# Patient Record
Sex: Male | Born: 1935 | Race: Black or African American | Hispanic: No | Marital: Married | Smoking: Never smoker
Health system: Southern US, Community
[De-identification: ages and names within clinical notes are randomized; demographics above are authoritative.]

## PROBLEM LIST (undated history)

## (undated) DIAGNOSIS — C801 Malignant (primary) neoplasm, unspecified: Secondary | ICD-10-CM

## (undated) DIAGNOSIS — I10 Essential (primary) hypertension: Secondary | ICD-10-CM

## (undated) HISTORY — PX: EYE SURGERY: SHX253

---

## 2007-03-13 ENCOUNTER — Other Ambulatory Visit: Payer: Self-pay

## 2007-03-13 ENCOUNTER — Emergency Department: Payer: Self-pay | Admitting: Emergency Medicine

## 2007-04-13 ENCOUNTER — Other Ambulatory Visit: Payer: Self-pay

## 2007-04-13 ENCOUNTER — Emergency Department: Payer: Self-pay | Admitting: Emergency Medicine

## 2010-02-14 ENCOUNTER — Ambulatory Visit: Payer: Self-pay | Admitting: Family Medicine

## 2010-02-14 ENCOUNTER — Observation Stay: Payer: Self-pay | Admitting: Internal Medicine

## 2013-04-29 ENCOUNTER — Emergency Department: Payer: Self-pay | Admitting: Emergency Medicine

## 2017-01-01 ENCOUNTER — Emergency Department
Admission: EM | Admit: 2017-01-01 | Discharge: 2017-01-01 | Disposition: A | Discharge status: Home / Self Care | Payer: Medicare HMO | Attending: Emergency Medicine | Admitting: Emergency Medicine

## 2017-01-01 DIAGNOSIS — W5501XA Bitten by cat, initial encounter: Secondary | ICD-10-CM | POA: Insufficient documentation

## 2017-01-01 DIAGNOSIS — Y999 Unspecified external cause status: Secondary | ICD-10-CM | POA: Diagnosis not present

## 2017-01-01 DIAGNOSIS — Y929 Unspecified place or not applicable: Secondary | ICD-10-CM | POA: Diagnosis not present

## 2017-01-01 DIAGNOSIS — Y939 Activity, unspecified: Secondary | ICD-10-CM | POA: Insufficient documentation

## 2017-01-01 DIAGNOSIS — S51851A Open bite of right forearm, initial encounter: Secondary | ICD-10-CM | POA: Diagnosis not present

## 2017-01-01 MED ORDER — AMOXICILLIN-POT CLAVULANATE 875-125 MG PO TABS: 1.0000 | ORAL_TABLET | Freq: Two times a day (BID) | ORAL | 0 refills | Status: AC

## 2017-01-01 MED ORDER — AMOXICILLIN-POT CLAVULANATE 875-125 MG PO TABS
1.0000 | ORAL_TABLET | Freq: Once | ORAL | Status: AC
  Administered 2017-01-01: 1 via ORAL
  Filled 2017-01-01: qty 1

## 2017-01-01 NOTE — ED Notes (Signed)
Attempted to called animal control. Did not get an answer. Left message for someone to call us back.   Number provided for pt as well.

## 2017-01-01 NOTE — Discharge Instructions (Signed)
You are being treated for skin infection after cat bite with antibiotic called Augmentin.  Animal control has been notified to be in touch about 10 monitoring.  You need to have the infection rechecked in 2 days to ensure that its heading their direction.  Return to the emergency department immediately for any worsening redness or pain or swelling, any extension outside the line that was marked on your skin, or certainly any system wide symptoms such as fever, headache, vomiting, or any other symptoms concerning to you.

## 2017-01-01 NOTE — ED Provider Notes (Signed)
[facility] Emergency Department Provider Note ____________________________________________   I have reviewed the triage vital signs and the triage nursing note.  HISTORY  Chief Complaint Animal Bite   Historian Patient  HPI Joseph Estrada is a 83 y.o. male sustained Bite from his cat while playing 2 days ago to his right top of the forearm. There is an area of redness and swelling without drainage. There is a small scab. Mild tenderness. States his cat is up-to-date on rabies vaccination.  No systemic issues including headache, vomiting, or fevers. Nothing makes it worse or better.    No past medical history on file.  There are no active problems to display for this patient.   No past surgical history on file.  Prior to Admission medications   Medication Sig Start Date End Date Taking? Authorizing Provider  amoxicillin-clavulanate (AUGMENTIN) 875-125 MG tablet Take 1 tablet by mouth 2 (two) times daily. 01/01/17 01/11/17  Lisa Roca, MD    No Known Allergies  No family history on file.  Social History Social History  Substance Use Topics  . Smoking status: Not on file  . Smokeless tobacco: Not on file  . Alcohol use Not on file    Review of Systems  Constitutional: Negative for fever. Eyes: Negative for visual changes. ENT: Negative for sore throat. Cardiovascular: Negative for chest pain. Respiratory: Negative for shortness of breath. Gastrointestinal: Negative for abdominal pain, vomiting and diarrhea. Genitourinary:  Musculoskeletal: Negative for back pain. Skin: Negative for rash. Neurological: Negative for headache. 10 point Review of Systems otherwise negative ____________________________________________   PHYSICAL EXAM:  VITAL SIGNS: ED Triage Vitals  Enc Vitals Group     BP 01/01/17 0548 (!) 172/73     Pulse Rate 01/01/17 0548 86     Resp 01/01/17 0548 18     Temp 01/01/17 0548 98 F (36.7 C)     Temp Source  01/01/17 0548 Oral     SpO2 01/01/17 0548 98 %     Weight 01/01/17 0546 160 lb (72.6 kg)     Height 01/01/17 0546 5\' 9"  (1.753 m)     Head Circumference --      Peak Flow --      Pain Score 01/01/17 0545 2     Pain Loc --      Pain Edu? --      Excl. in [place]? --      Constitutional: Alert and oriented. Well appearing and in no distress. HEENT   Head: Normocephalic and atraumatic.      Eyes: Conjunctivae are normal. PERRL. Normal extraocular movements.      Ears:         Nose: No congestion/rhinnorhea.   Mouth/Throat: Mucous membranes are moist.   Neck: No stridor. Cardiovascular/Chest: Normal rate, regular rhythm.  No murmurs, rubs, or gallops. Respiratory: Normal respiratory effort without tachypnea nor retractions. Breath sounds are clear and equal bilaterally. No wheezes/rales/rhonchi. Gastrointestinal:  Genitourinary/rectal:Deferred Musculoskeletal: Right dorsal forearm has an area of redness and swelling with a scab, approximately palm sized patch which was marked by skin marker. No fluctuance. No blistering. Neurologic:  Normal speech and language. No gross or focal neurologic deficits are appreciated. Skin:  Skin is warm, dry and intact. Erythema and warmth to the right dorsal forearm.Marland Kitchen Psychiatric: Mood and affect are normal. Speech and behavior are normal. Patient exhibits appropriate insight and judgment.   ____________________________________________  LABS (pertinent positives/negatives)  Labs Reviewed - No data to display  ____________________________________________  EKG I, Lisa Roca, MD, the attending physician have personally viewed and interpreted all ECGs.  None ____________________________________________  RADIOLOGY All Xrays were viewed by me. Imaging interpreted by Radiologist.  None __________________________________________  PROCEDURES  Procedure(s) performed: None  Critical Care performed:  None  ____________________________________________   ED COURSE / ASSESSMENT AND PLAN  Pertinent labs & imaging results that were available during my care of the patient were reviewed by me and considered in my medical decision making (see chart for details).   Mr. Ward Givens presents 2 days after his own vaccinated cat bit him while playing, and states he typically doesn't get infection, but this time it looks infected.  It does look infected, patient will be treated with Augmentin 875mg  bid x 10 day course. His first tablet was given here, he sent with a prescription.  Appears reasonable for outpatient management.  I asked the nurse to contact/report the animal bite.  Patient understands report to animal control for likely in home 10 day monitoring.    CONSULTATIONS:  None Patient / Family / Caregiver informed of clinical course, medical decision-making process, and agree with plan.   I discussed return precautions, follow-up instructions, and discharge instructions with patient and/or family.  Discharge instructions:  You are being treated for skin infection after cat bite with antibiotic called Augmentin.  Animal control has been notified to be in touch about 10 monitoring.  You need to have the infection rechecked in 2 days to ensure that its heading their direction.  Return to the emergency department immediately for any worsening redness or pain or swelling, any extension outside the line that was marked on your skin, or certainly any system wide symptoms such as fever, headache, vomiting, or any other symptoms concerning to you.  ___________________________________________   FINAL CLINICAL IMPRESSION(S) / ED DIAGNOSES   Final diagnoses:  Cat bite, initial encounter              Note: This dictation was prepared with Dragon dictation. Any transcriptional errors that result from this process are unintentional    Lisa Roca, MD 01/01/17 [phone]

## 2017-01-01 NOTE — ED Triage Notes (Signed)
Reports his cat bite him 2 days ago.  Patient with area noted to right forearm that is red and swollen.

## 2017-07-01 ENCOUNTER — Emergency Department: Payer: Medicare HMO

## 2017-07-01 ENCOUNTER — Encounter: Payer: Self-pay | Admitting: Emergency Medicine

## 2017-07-01 ENCOUNTER — Emergency Department
Admission: EM | Admit: 2017-07-01 | Discharge: 2017-07-01 | Disposition: A | Discharge status: Home / Self Care | Payer: Medicare HMO | Attending: Emergency Medicine | Admitting: Emergency Medicine

## 2017-07-01 DIAGNOSIS — T189XXA Foreign body of alimentary tract, part unspecified, initial encounter: Secondary | ICD-10-CM | POA: Insufficient documentation

## 2017-07-01 DIAGNOSIS — R1310 Dysphagia, unspecified: Secondary | ICD-10-CM | POA: Diagnosis present

## 2017-07-01 DIAGNOSIS — X58XXXA Exposure to other specified factors, initial encounter: Secondary | ICD-10-CM | POA: Insufficient documentation

## 2017-07-01 DIAGNOSIS — I10 Essential (primary) hypertension: Secondary | ICD-10-CM | POA: Diagnosis not present

## 2017-07-01 DIAGNOSIS — Y929 Unspecified place or not applicable: Secondary | ICD-10-CM | POA: Insufficient documentation

## 2017-07-01 DIAGNOSIS — Y9389 Activity, other specified: Secondary | ICD-10-CM | POA: Insufficient documentation

## 2017-07-01 DIAGNOSIS — Z8546 Personal history of malignant neoplasm of prostate: Secondary | ICD-10-CM | POA: Insufficient documentation

## 2017-07-01 DIAGNOSIS — Y998 Other external cause status: Secondary | ICD-10-CM | POA: Diagnosis not present

## 2017-07-01 HISTORY — DX: Malignant (primary) neoplasm, unspecified: C80.1

## 2017-07-01 HISTORY — DX: Essential (primary) hypertension: I10

## 2017-07-01 NOTE — ED Notes (Signed)
Patient states he was able to drink water without difficulty.

## 2017-07-01 NOTE — Discharge Instructions (Signed)
Return to the ER for new, worsening, or recurrent pain in the throat, feeling like your throat is closing, difficulty breathing, cough, weakness, or any other new or worsening symptoms that concern you.

## 2017-07-01 NOTE — ED Triage Notes (Signed)
Pt arrived via EMS from home. Pt reports he was taking his medications about an hour ago when one of the pills got stuck in his throat, pt states the same pill frequently gets stuck on the roof of his mouth, but never this bad. Pt states he choked and then threw up some water. Pt also concerned when he blew his nose, blood came out.  Pt c/o being hoarse now and having a tickle in his throat.

## 2017-07-01 NOTE — ED Notes (Signed)
Daughter at bedside, determine the patient choked on Flomax capsule.

## 2017-07-01 NOTE — ED Provider Notes (Signed)
[name] Emergency Department Provider Note ____________________________________________   First MD Initiated Contact with Patient 07/01/17 1557     (approximate)  I have reviewed the triage vital signs and the nursing notes.   HISTORY  Chief Complaint Dysphagia    HPI Joseph Estrada is a 83 y.o. male with past medical history of prostate cancer hypertension who presents with throat discomfort, acute onset after he got a pill stuck in the throat, associated with coughing and briefly with shortness of breath, now resolved.  Patient states that after he took the pill it felt stuck, he began to cough, and then had a "panic attack."  Patient states that he still feels a "tickle" in the back of his throat but other symptoms have resolved.  He reports small amount of blood when he blew his nose.  No chest pain.   Past Medical History:  Diagnosis Date  . Cancer [facility])    prostate  . Hypertension     There are no active problems to display for this patient.   History reviewed. No pertinent surgical history.  Prior to Admission medications   Not on File    Allergies Patient has no known allergies.  History reviewed. No pertinent family history.  Social History Social History  Substance Use Topics  . Smoking status: Never Smoker  . Smokeless tobacco: Never Used  . Alcohol use No    Review of Systems  Constitutional: No fever/chills Eyes: No redness. ENT: Positive for sore throat. Cardiovascular: Denies chest pain. Respiratory: Denies shortness of breath. Gastrointestinal: No nausea, no vomiting.   Genitourinary: Negative for flank pain.  Musculoskeletal: Negative for back pain. Skin: Negative for rash. Neurological: Negative for headache.   ____________________________________________   PHYSICAL EXAM:  VITAL SIGNS: ED Triage Vitals  Enc Vitals Group     BP 07/01/17 1533 139/64     Pulse Rate 07/01/17 1533 71     Resp 07/01/17  1533 18     Temp 07/01/17 1533 98.1 F (36.7 C)     Temp Source 07/01/17 1533 Oral     SpO2 07/01/17 1523 98 %     Weight 07/01/17 1524 150 lb (68 kg)     Height 07/01/17 1524 5\' 9"  (1.753 m)     Head Circumference --      Peak Flow --      Pain Score 07/01/17 1524 0     Pain Loc --      Pain Edu? --      Excl. in [place]? --     Constitutional: Alert and oriented. Well appearing and in no acute distress. Eyes: Conjunctivae are normal.  Head: Atraumatic. Nose: No congestion/rhinnorhea. Mouth/Throat: Mucous membranes are moist.  Oropharynx clear.  No stridor.  Neck: Normal range of motion.  Cardiovascular: Normal rate, regular rhythm. Grossly normal heart sounds.  Good peripheral circulation. Respiratory: Normal respiratory effort.  No retractions. Trace wheeze to L lung.  Gastrointestinal: No distention.  Musculoskeletal:  Extremities warm and well perfused.  Neurologic:  Normal speech and language. No gross focal neurologic deficits are appreciated.  Skin:  Skin is warm and dry. No rash noted. Psychiatric: Mood and affect are normal. Speech and behavior are normal.  ____________________________________________   LABS (all labs ordered are listed, but only abnormal results are displayed)  Labs Reviewed - No data to display ____________________________________________  EKG   ____________________________________________  RADIOLOGY  CXR: No acute findings.  ____________________________________________   PROCEDURES  Procedure(s) performed: No  Critical Care performed: No ____________________________________________   INITIAL IMPRESSION / ASSESSMENT AND PLAN / ED COURSE  Pertinent labs & imaging results that were available during my care of the patient were reviewed by me and considered in my medical decision making (see chart for details).  83 year old male presents with resolved cough and difficulty breathing after he felt a capsule type pill get stuck in the  back of his throat.  Patient reports a tickle to the back of the throat but is otherwise asymptomatic.  On exam, patient is well-appearing, vital signs are normal, and is trace wheeze to the left lung but otherwise no significant findings.  Patient is tolerating PO fluids without difficulty.  Overall suspect the patient had coughing from having the pill stuck briefly in the throat, and there is a small chance that he aspirated, although given his lack of shortness of breath and reassuring exam currently I have low suspicion for this.  Will obtain CXR; if negative d/c home.     ----------------------------------------- 5:48 PM on 07/01/2017 -----------------------------------------  Chest x-ray is negative and patient remains comfortable.  Return precautions given.  Will discharge home.  ____________________________________________   FINAL CLINICAL IMPRESSION(S) / ED DIAGNOSES  Final diagnoses:  Swallowed foreign body, initial encounter      NEW MEDICATIONS STARTED DURING THIS VISIT:  New Prescriptions   No medications on file     Note:  This document was prepared using Dragon voice recognition software and may include unintentional dictation errors.     Arta Silence, MD 07/01/17 [phone]

## 2017-12-24 ENCOUNTER — Other Ambulatory Visit: Payer: Self-pay

## 2017-12-24 ENCOUNTER — Ambulatory Visit
Admission: EM | Admit: 2017-12-24 | Discharge: 2017-12-24 | Disposition: A | Discharge status: Home / Self Care | Payer: Medicare HMO | Attending: Emergency Medicine | Admitting: Emergency Medicine

## 2017-12-24 DIAGNOSIS — H1131 Conjunctival hemorrhage, right eye: Secondary | ICD-10-CM | POA: Diagnosis not present

## 2017-12-24 NOTE — ED Triage Notes (Signed)
Pt states sclera redness yesterday but much worse on waking today. No itching, burning, pain, or drainage.  VISUAL ACUITY: Corrected right eye: 20/30 Corrected left eye: 20/30 Corrected both eyes: 20/25

## 2017-12-24 NOTE — ED Provider Notes (Signed)
[facility]    CSN: [id number] Arrival date & time: 12/24/17  1446     History   Chief Complaint Chief Complaint  Patient presents with  . Eye Problem    HPI Joseph Estrada is a 84 y.o. male.   HPI  Male presents redness of his right eye which his daughter noticed today.  It seemed to be worsening today than yesterday according to the patient he has had no itching no burning no pain no drainage no visual changes.  Had no matting.  Denies photophobia does not wear contact lenses.  Rubbing his eye vigorously yesterday.  Visual acuity is normal corrected.        Past Medical History:  Diagnosis Date  . Cancer [facility])    prostate  . Hypertension     There are no active problems to display for this patient.   Past Surgical History:  Procedure Laterality Date  . EYE SURGERY         Home Medications    Prior to Admission medications   Medication Sig Start Date End Date Taking? Authorizing Provider  aspirin EC 81 MG tablet Take by mouth. 01/15/16 09/23/29 Yes [provider]  [place] 5000-500 UNIT/5ML OIL Take by mouth. 07/20/13  Yes [provider]  losartan-hydrochlorothiazide (HYZAAR) 100-25 MG tablet Take by mouth. 11/15/17 11/15/18 Yes [provider]  tamsulosin (FLOMAX) 0.4 MG CAPS capsule Take 0.4 mg by mouth.   Yes [provider]    Family History History reviewed. No pertinent family history.  Social History Social History   Tobacco Use  . Smoking status: Never Smoker  . Smokeless tobacco: Never Used  Substance Use Topics  . Alcohol use: No  . Drug use: No     Allergies   Patient has no known allergies.   Review of Systems Review of Systems  Constitutional: Negative for activity change, appetite change, chills, diaphoresis, fatigue and fever.  Eyes: Positive for redness. Negative for photophobia.  All other systems reviewed and are negative.    Physical Exam Triage Vital Signs ED  Triage Vitals  Enc Vitals Group     BP 12/24/17 1504 131/73     Pulse Rate 12/24/17 1504 78     Resp 12/24/17 1504 18     Temp 12/24/17 1504 97.9 F (36.6 C)     Temp Source 12/24/17 1504 Oral     SpO2 12/24/17 1504 100 %     Weight 12/24/17 1459 145 lb (65.8 kg)     Height 12/24/17 1459 5\' 9"  (1.753 m)     Head Circumference --      Peak Flow --      Pain Score 12/24/17 1459 0     Pain Loc --      Pain Edu? --      Excl. in [place]? --    No data found.  Updated Vital Signs BP 131/73 (BP Location: Right Arm)   Pulse 78   Temp 97.9 F (36.6 C) (Oral)   Resp 18   Ht 5\' 9"  (1.753 m)   Wt 145 lb (65.8 kg)   SpO2 100%   BMI 21.41 kg/m   Visual Acuity Right Eye Distance:   Left Eye Distance:   Bilateral Distance:    Right Eye Near:   Left Eye Near:    Bilateral Near:     Physical Exam  Constitutional: He is oriented to person, place, and time. He appears well-developed and well-nourished.  No distress.  HENT:  Head: Normocephalic.  Eyes: Pupils are equal, round, and reactive to light. EOM are normal. Right eye exhibits no discharge. Left eye exhibits no discharge.  Examination of the right eye shows PERRLA with EOMs intact.  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  There is a subconjunctival hemorrhage over the medial aspect of the conjunctivae bending to just midpoint of the iris mostly inferior.  Neck: Normal range of motion.  Pulmonary/Chest: Effort normal and breath sounds normal.  Musculoskeletal: Normal range of motion.  Lymphadenopathy:    He has no cervical adenopathy.  Neurological: He is alert and oriented to person, place, and time.  Skin: Skin is warm and dry. He is not diaphoretic.  Psychiatric: He has a normal mood and affect. His behavior is normal. Judgment and thought content normal.  Nursing note and vitals reviewed.    UC Treatments / Results  Labs (all labs ordered are listed, but only abnormal results are displayed) Labs Reviewed - No data to  display  EKG None Radiology No results found.  Procedures Procedures (including critical care time)  Medications Ordered in UC Medications - No data to display   Initial Impression / Assessment and Plan / UC Course  I have reviewed the triage vital signs and the nursing notes.  Pertinent labs & imaging results that were available during my care of the patient were reviewed by me and considered in my medical decision making (see chart for details).     Plan: 1. Test/x-ray results and diagnosis reviewed with patient 2. rx as per orders; risks, benefits, potential side effects reviewed with patient 3. Recommend supportive treatment with reassurance.  Is not better in a week or so he should follow-up with an ophthalmologist. Warned Against rubbing his eye any more.  Compresses may be comforting. 4. F/u prn if symptoms worsen or don't improve   Final Clinical Impressions(s) / UC Diagnoses   Final diagnoses:  Subconjunctival bleed, right    ED Discharge Orders    None       Controlled Substance Prescriptions [place] Controlled Substance Registry consulted? Not Applicable   Lorin Picket, PA-C 12/24/17 1528

## 2018-12-22 ENCOUNTER — Other Ambulatory Visit: Payer: Self-pay

## 2018-12-22 ENCOUNTER — Emergency Department: Payer: Medicare HMO

## 2018-12-22 ENCOUNTER — Emergency Department
Admission: EM | Admit: 2018-12-22 | Discharge: 2018-12-22 | Disposition: A | Discharge status: Home / Self Care | Payer: Medicare HMO | Attending: Emergency Medicine | Admitting: Emergency Medicine

## 2018-12-22 DIAGNOSIS — Z79899 Other long term (current) drug therapy: Secondary | ICD-10-CM | POA: Insufficient documentation

## 2018-12-22 DIAGNOSIS — C61 Malignant neoplasm of prostate: Secondary | ICD-10-CM | POA: Diagnosis not present

## 2018-12-22 DIAGNOSIS — R270 Ataxia, unspecified: Secondary | ICD-10-CM

## 2018-12-22 DIAGNOSIS — I10 Essential (primary) hypertension: Secondary | ICD-10-CM | POA: Diagnosis not present

## 2018-12-22 DIAGNOSIS — E860 Dehydration: Secondary | ICD-10-CM

## 2018-12-22 DIAGNOSIS — Z7982 Long term (current) use of aspirin: Secondary | ICD-10-CM | POA: Insufficient documentation

## 2018-12-22 DIAGNOSIS — R42 Dizziness and giddiness: Secondary | ICD-10-CM

## 2018-12-22 DIAGNOSIS — R7989 Other specified abnormal findings of blood chemistry: Secondary | ICD-10-CM

## 2018-12-22 LAB — URINALYSIS, COMPLETE (UACMP) WITH MICROSCOPIC
Bacteria, UA: NONE SEEN
Bilirubin Urine: NEGATIVE
Glucose, UA: NEGATIVE mg/dL
Hgb urine dipstick: NEGATIVE
Ketones, ur: NEGATIVE mg/dL
Leukocytes,Ua: NEGATIVE
Nitrite: NEGATIVE
Protein, ur: NEGATIVE mg/dL
Specific Gravity, Urine: 1.012 (ref 1.005–1.030)
Squamous Epithelial / LPF: NONE SEEN (ref 0–5)
pH: 6 (ref 5.0–8.0)

## 2018-12-22 LAB — COMPREHENSIVE METABOLIC PANEL
ALT: 14 U/L (ref 0–44)
AST: 22 U/L (ref 15–41)
Albumin: 3.8 g/dL (ref 3.5–5.0)
Alkaline Phosphatase: 60 U/L (ref 38–126)
Anion gap: 8 (ref 5–15)
BUN: 24 mg/dL — ABNORMAL HIGH (ref 8–23)
CO2: 27 mmol/L (ref 22–32)
Calcium: 8.6 mg/dL — ABNORMAL LOW (ref 8.9–10.3)
Chloride: 103 mmol/L (ref 98–111)
Creatinine, Ser: 1.49 mg/dL — ABNORMAL HIGH (ref 0.61–1.24)
GFR calc Af Amer: 47 mL/min — ABNORMAL LOW (ref >60)
GFR calc non Af Amer: 41 mL/min — ABNORMAL LOW (ref >60)
Glucose, Bld: 99 mg/dL (ref 70–99)
Potassium: 3.5 mmol/L (ref 3.5–5.1)
Sodium: 138 mmol/L (ref 135–145)
Total Bilirubin: 0.7 mg/dL (ref 0.3–1.2)
Total Protein: 6.5 g/dL (ref 6.5–8.1)

## 2018-12-22 LAB — CBC WITH DIFFERENTIAL/PLATELET
Abs Immature Granulocytes: 0.01 10*3/uL (ref 0.00–0.07)
Basophils Absolute: 0 10*3/uL (ref 0.0–0.1)
Basophils Relative: 0 %
Eosinophils Absolute: 0.1 10*3/uL (ref 0.0–0.5)
Eosinophils Relative: 1 %
HCT: 37 % — ABNORMAL LOW (ref 39.0–52.0)
Hemoglobin: 12.4 g/dL — ABNORMAL LOW (ref 13.0–17.0)
Immature Granulocytes: 0 %
Lymphocytes Relative: 40 %
Lymphs Abs: 1.7 10*3/uL (ref 0.7–4.0)
MCH: 29.4 pg (ref 26.0–34.0)
MCHC: 33.5 g/dL (ref 30.0–36.0)
MCV: 87.7 fL (ref 80.0–100.0)
Monocytes Absolute: 0.4 10*3/uL (ref 0.1–1.0)
Monocytes Relative: 10 %
Neutro Abs: 2.1 10*3/uL (ref 1.7–7.7)
Neutrophils Relative %: 49 %
Platelets: 184 10*3/uL (ref 150–400)
RBC: 4.22 MIL/uL (ref 4.22–5.81)
RDW: 13.7 % (ref 11.5–15.5)
WBC: 4.3 10*3/uL (ref 4.0–10.5)
nRBC: 0 % (ref 0.0–0.2)

## 2018-12-22 LAB — TROPONIN I: Troponin I: <0.03 ng/mL (ref <0.03)

## 2018-12-22 LAB — LIPASE, BLOOD: Lipase: 25 U/L (ref 11–51)

## 2018-12-22 MED ORDER — SODIUM CHLORIDE 0.9 % IV BOLUS
500.0000 mL | Freq: Once | INTRAVENOUS | Status: AC
  Administered 2018-12-22: 500 mL via INTRAVENOUS

## 2018-12-22 MED ORDER — MECLIZINE HCL 12.5 MG PO TABS: 12.5000 mg | ORAL_TABLET | Freq: Three times a day (TID) | ORAL | 0 refills | Status: AC | PRN

## 2018-12-22 NOTE — ED Triage Notes (Signed)
Pt from home with dizziness that began at 0400 this am. perrl brisk 78mm. Pt alert and oriented x4, moving all extremities. Pt denies pain, nausea, shob, cough. md at bedside. Pt states he is having difficulty maintaining his balance.

## 2018-12-22 NOTE — ED Notes (Signed)
This RN spoke to pt daughter who stated she would come pick pt up.

## 2018-12-22 NOTE — ED Notes (Signed)
Pt stated that he felt dizzy when he went from a lying to sitting position.

## 2018-12-22 NOTE — ED Notes (Signed)
Pt given urinal to urinate into. Stated he still felt dizzy so instructed not to get out of bed. Verbalized has used urinal before and felt comfortable using it on own on stretcher.

## 2018-12-22 NOTE — ED Notes (Signed)
Pt given phone to call daughter. Stood beside bed to use urinal. No distress noted.

## 2018-12-22 NOTE — ED Provider Notes (Signed)
Patient reports his dizziness is better but not completely gone.  He is able to walk now.  RI is negative shows no focal new findings.  I will let him go with some meclizine.  Have him follow-up with his regular doctor this coming week and return if he has any worsening.  I discussed with him the fact that it could have been a stroke or something in the inner ear but the fact that the MRI is negative and he still having symptoms means is almost definitely something in his inner ear.  Meclizine may help with that.   Nena Polio, MD 12/22/18 1037

## 2018-12-22 NOTE — ED Provider Notes (Signed)
[facility] Emergency Department Provider Note  ____________________________________________   First MD Initiated Contact with Patient 12/22/18 [phone]     (approximate)  I have reviewed the triage vital signs and the nursing notes.   HISTORY  Chief Complaint Dizziness    HPI Joseph Estrada is a 85 y.o. male with medical history as listed below with medical history as listed below who presents by EMS for evaluation of acute onset and severe dizziness and difficulty with ambulation.  He reports he has been in his normal state of health recently, even working outside in the yard, and he felt normal when he went to sleep.  However he got up to go to the bathroom and he had a very difficult time maintaining his balance and reports that he was "bouncing off the walls".  He had a difficult time finding his way back to the bed.  He did not feel like he was going to pass out or become lightheaded, just felt very off balance with difficulty ambulating.  He denies headache, neck pain, nasal congestion, sore throat, chest pain, shortness of breath, cough, abdominal pain, nausea, vomiting, and visual changes.  He has had no pain when he urinates.  He states that he had a similar episode last summer and they gave him a medicine to help with the dizziness but he does not have to take it regularly.  He has been compliant with his blood pressure medicine.  He says he used to take an aspirin but he was told he did not have to take it anymore and he takes no other blood thinners.  He feels better than he did before but he still feels a little bit "off".  At no point did he have any weakness or numbness in any of his arms or his legs, he just felt like he could not walk.   He has been trying to self isolate except for with his family given the current COVID-19 pandemic but he has not been in contact with anyone known to have been diagnosed with the disease.        Past Medical History:   Diagnosis Date  . Cancer [facility])    prostate  . Hypertension     There are no active problems to display for this patient.   Past Surgical History:  Procedure Laterality Date  . EYE SURGERY      Prior to Admission medications   Medication Sig Start Date End Date Taking? Authorizing Provider  aspirin EC 81 MG tablet Take by mouth. 01/15/16 09/23/29  [provider]  [place] 5000-500 UNIT/5ML OIL Take by mouth. 07/20/13   [provider]  losartan-hydrochlorothiazide (HYZAAR) 100-25 MG tablet Take by mouth. 11/15/17 11/15/18  [provider]  tamsulosin (FLOMAX) 0.4 MG CAPS capsule Take 0.4 mg by mouth.    [provider]    Allergies Patient has no known allergies.  No family history on file.  Social History Social History   Tobacco Use  . Smoking status: Never Smoker  . Smokeless tobacco: Never Used  Substance Use Topics  . Alcohol use: No  . Drug use: No    Review of Systems Constitutional: No fever/chills Eyes: No visual changes. ENT: No sore throat. Cardiovascular: Denies chest pain. Respiratory: Denies shortness of breath. Gastrointestinal: No abdominal pain.  No nausea, no vomiting.  No diarrhea.  No constipation. Genitourinary: Negative for dysuria. Musculoskeletal: Negative for neck pain.  Negative for back pain. Integumentary:  Negative for rash. Neurological: Acute onset and severe dizziness/ataxia as described above.  No focal numbness nor weakness.   ____________________________________________   PHYSICAL EXAM:  VITAL SIGNS: ED Triage Vitals  Enc Vitals Group     BP 12/22/18 0614 (!) 177/84     Pulse Rate 12/22/18 0614 60     Resp 12/22/18 0614 14     Temp 12/22/18 0614 98 F (36.7 C)     Temp Source 12/22/18 0614 Oral     SpO2 12/22/18 0614 100 %     Weight 12/22/18 0612 61.2 kg (135 lb)     Height 12/22/18 0612 1.753 m (5\' 9" )     Head Circumference --      Peak Flow --      Pain Score 12/22/18 0611  0     Pain Loc --      Pain Edu? --      Excl. in [place]? --     Constitutional: Alert and oriented. Well appearing and in no acute distress.  Appears considerably younger than his chronological age of 7 years. Eyes: Conjunctivae are normal. PERRL. EOMI. Head: Atraumatic. Nose: No congestion/rhinnorhea. Mouth/Throat: Mucous membranes are moist. Neck: No stridor.  No meningeal signs.   Cardiovascular: Normal rate, regular rhythm. Good peripheral circulation. Grossly normal heart sounds. Respiratory: Normal respiratory effort.  No retractions. No audible wheezing. Gastrointestinal: Soft and nontender. No distention.  Musculoskeletal: No lower extremity tenderness nor edema. No gross deformities of extremities. Neurologic:  Normal speech and language. No gross focal neurologic deficits are appreciated.  The patient has good grip strength bilaterally, good bilateral leg extension strength, and no dysmetria on finger-to-nose testing. Skin:  Skin is warm, dry and intact. No rash noted. Psychiatric: Mood and affect are normal. Speech and behavior are normal.  ____________________________________________   LABS (all labs ordered are listed, but only abnormal results are displayed)  Labs Reviewed  COMPREHENSIVE METABOLIC PANEL - Abnormal; Notable for the following components:      Result Value   BUN 24 (*)    Creatinine, Ser 1.49 (*)    Calcium 8.6 (*)    GFR calc non Af Amer 41 (*)    GFR calc Af Amer 47 (*)    All other components within normal limits  CBC WITH DIFFERENTIAL/PLATELET - Abnormal; Notable for the following components:   Hemoglobin 12.4 (*)    HCT 37.0 (*)    All other components within normal limits  LIPASE, BLOOD  TROPONIN I  URINALYSIS, COMPLETE (UACMP) WITH MICROSCOPIC   ____________________________________________  EKG  ED ECG REPORT I, Hinda Kehr, the attending physician, personally viewed and interpreted this ECG.  Date: 12/22/2018 EKG Time: 6:14 AM  Rate: 58 Rhythm: Borderline sinus bradycardia QRS Axis: normal Intervals: normal ST/T Wave abnormalities: normal Narrative Interpretation: no evidence of acute ischemia  ____________________________________________  RADIOLOGY   ED MD interpretation:  MRIs pending  Official radiology report(s): No results found.  ____________________________________________   PROCEDURES   Procedure(s) performed (including Critical Care):  Procedures   ____________________________________________   INITIAL IMPRESSION / MDM / [place] / ED COURSE  As part of my medical decision making, I reviewed the following data within the [place] notes reviewed and incorporated, Labs reviewed , EKG interpreted , Old chart reviewed, Patient signed out to Dr. Cinda Quest and Notes from prior ED visits      JAYIN DEROUSSE was evaluated in Emergency Department on 12/22/2018 for the symptoms described in the  history of present illness. He was evaluated in the context of the global COVID-19 pandemic, which necessitated consideration that the patient might be at risk for infection with the SARS-CoV-2 virus that causes COVID-19. Institutional protocols and algorithms that pertain to the evaluation of patients at risk for COVID-19 are in a state of rapid change based on information released by regulatory bodies including the CDC and federal and state organizations. These policies and algorithms were followed during the patient's care in the ED.  Differential diagnosis includes, but is not limited to, Brief orthostatic episode, CVA/TIA, vasovagal episode, metabolic or electrolyte abnormality, posterior circulation disruption, less likely ACS.  The patient is quite well-appearing and does not look to be 85 years old.  He is also quite active at baseline.  He is hypertensive currently but this is likely his baseline as well based on his medical record which I reviewed.  His vital signs  are otherwise stable.  He has no gross neurological deficits at this time and I do not think that there is any utility in obtaining a noncontrast head CT.  Given the patient's age and hypertensive history as well as his symptoms of ataxia and questionable vertigo, I think it is appropriate to obtain an MR brain without contrast as well as an MRA head to look for posterior circulation issues.  He feels better at this time but I believe that he is a fall risk and I would prefer that he not try to ambulate currently.  Lab work is pending.  EKG is within normal limits and generally well-appearing.  I will continue to monitor.  If he has no abnormalities on his MRIs and feels better and has no findings that require admission, he may be appropriate for discharge and outpatient follow-up as this could have been just a transient episode.  He agrees with the plan.  Clinical Course as of Dec 22 715  Sat Dec 22, 2018  0623 Reassuring CBC with no leukocytosis, hemoglobin is slightly low technically speaking but still appropriate.  CBC with Differential(!) [CF]  [id number] CMP is generally reassuring although his creatinine is almost 1.5.  I reviewed his medical record including lab results on care everywhere and typically his creatinine, as recently as a month ago, was about 1.1.  He is likely mildly dehydrated which could explain his symptoms particularly if his MRI is normal.  I have ordered a small normal saline fluid bolus of 500 mL try to rehydrate him.   [CF]  0707 Troponin I: <0.03 [CF]  0707 Transferring ED care to Dr. Cinda Quest to follow up MRI and urinalysis and disposition patient appropriately.   [CF]    Clinical Course User Index [CF] Hinda Kehr, MD     ____________________________________________  FINAL CLINICAL IMPRESSION(S) / ED DIAGNOSES  Final diagnoses:  Ataxia  Elevated serum creatinine  Mild dehydration     MEDICATIONS GIVEN DURING THIS VISIT:  Medications  sodium chloride 0.9 %  bolus 500 mL (500 mLs Intravenous New Bag/Given 12/22/18 1610)     ED Discharge Orders    None       Note:  This document was prepared using Dragon voice recognition software and may include unintentional dictation errors.   Hinda Kehr, MD 12/22/18 [phone]

## 2018-12-22 NOTE — ED Notes (Signed)
Pt able to stand beside bed on own to use urinal. No distress noted.

## 2018-12-22 NOTE — Discharge Instructions (Addendum)
Please return if you have any worsening.  Please follow-up with your regular doctor later on this coming week.  You can try the meclizine 1 pill 3 times a day.  Please be careful it may make you woozy or lightheaded.  If it does I would just stop it.  Occasionally it can also interfere with your ability to pass your water. Stop the meclizine if that happens as well.

## 2018-12-22 NOTE — ED Notes (Signed)
Pt taken to MRI via stretcher.  

## 2018-12-22 NOTE — ED Notes (Signed)
Pt returned from MRI via stretcher.

## 2018-12-30 ENCOUNTER — Other Ambulatory Visit: Payer: Self-pay

## 2018-12-30 ENCOUNTER — Encounter: Payer: Self-pay | Admitting: Emergency Medicine

## 2018-12-30 ENCOUNTER — Emergency Department
Admission: EM | Admit: 2018-12-30 | Discharge: 2018-12-30 | Disposition: A | Discharge status: Home / Self Care | Payer: Medicare HMO | Attending: Emergency Medicine | Admitting: Emergency Medicine

## 2018-12-30 DIAGNOSIS — I10 Essential (primary) hypertension: Secondary | ICD-10-CM | POA: Insufficient documentation

## 2018-12-30 DIAGNOSIS — Z79899 Other long term (current) drug therapy: Secondary | ICD-10-CM | POA: Diagnosis not present

## 2018-12-30 DIAGNOSIS — R42 Dizziness and giddiness: Secondary | ICD-10-CM | POA: Diagnosis not present

## 2018-12-30 LAB — URINALYSIS, COMPLETE (UACMP) WITH MICROSCOPIC
Bilirubin Urine: NEGATIVE
Glucose, UA: NEGATIVE mg/dL
Hgb urine dipstick: NEGATIVE
Ketones, ur: NEGATIVE mg/dL
Leukocytes,Ua: NEGATIVE
Nitrite: NEGATIVE
Protein, ur: NEGATIVE mg/dL
Specific Gravity, Urine: 1.006 (ref 1.005–1.030)
Squamous Epithelial / LPF: NONE SEEN (ref 0–5)
pH: 7 (ref 5.0–8.0)

## 2018-12-30 LAB — CBC WITH DIFFERENTIAL/PLATELET
Abs Immature Granulocytes: 0.01 10*3/uL (ref 0.00–0.07)
Basophils Absolute: 0 10*3/uL (ref 0.0–0.1)
Basophils Relative: 0 %
Eosinophils Absolute: 0 10*3/uL (ref 0.0–0.5)
Eosinophils Relative: 1 %
HCT: 39.3 % (ref 39.0–52.0)
Hemoglobin: 13.4 g/dL (ref 13.0–17.0)
Immature Granulocytes: 0 %
Lymphocytes Relative: 38 %
Lymphs Abs: 1.4 10*3/uL (ref 0.7–4.0)
MCH: 29.4 pg (ref 26.0–34.0)
MCHC: 34.1 g/dL (ref 30.0–36.0)
MCV: 86.2 fL (ref 80.0–100.0)
Monocytes Absolute: 0.4 10*3/uL (ref 0.1–1.0)
Monocytes Relative: 11 %
Neutro Abs: 1.9 10*3/uL (ref 1.7–7.7)
Neutrophils Relative %: 50 %
Platelets: 219 10*3/uL (ref 150–400)
RBC: 4.56 MIL/uL (ref 4.22–5.81)
RDW: 13.2 % (ref 11.5–15.5)
WBC: 3.8 10*3/uL — ABNORMAL LOW (ref 4.0–10.5)
nRBC: 0 % (ref 0.0–0.2)

## 2018-12-30 LAB — BASIC METABOLIC PANEL
Anion gap: 7 (ref 5–15)
BUN: 18 mg/dL (ref 8–23)
CO2: 30 mmol/L (ref 22–32)
Calcium: 9 mg/dL (ref 8.9–10.3)
Chloride: 97 mmol/L — ABNORMAL LOW (ref 98–111)
Creatinine, Ser: 1.38 mg/dL — ABNORMAL HIGH (ref 0.61–1.24)
GFR calc Af Amer: 52 mL/min — ABNORMAL LOW (ref >60)
GFR calc non Af Amer: 45 mL/min — ABNORMAL LOW (ref >60)
Glucose, Bld: 102 mg/dL — ABNORMAL HIGH (ref 70–99)
Potassium: 4.2 mmol/L (ref 3.5–5.1)
Sodium: 134 mmol/L — ABNORMAL LOW (ref 135–145)

## 2018-12-30 MED ORDER — MECLIZINE HCL 25 MG PO TABS
25.0000 mg | ORAL_TABLET | Freq: Once | ORAL | Status: AC
  Administered 2018-12-30: 25 mg via ORAL
  Filled 2018-12-30: qty 1

## 2018-12-30 NOTE — ED Provider Notes (Addendum)
[facility] Emergency Department Provider Note  ____________________________________________   I have reviewed the triage vital signs and the nursing notes. Where available I have reviewed prior notes and, if possible and indicated, outside hospital notes.    HISTORY  Chief Complaint Dizziness    HPI Joseph Estrada is a 85 y.o. male  With a history he states of recurrent vertigo for "years".  He was seen here on the 18th of this month at which time he had a negative MRI for vertigo he was diagnosed with vertigo, peripheral, and sent home.  He still has had occasional episodes of vertigo including this morning.  Usually last for few minutes and is completely resolved at this time.  He has no focal numbness or weakness or headache.  He states that he has had vomiting.  There is no exertional component to this.  No chest pain no lightheadedness.  No focal numbness or weakness and at this time he feels completely well.  He states that his daughter did not fill his vertigo prescription because "you will have to ask her".  EMS reports that the daughter felt that he did not need the prescription.  He was last here he was given Antivert in the department it appears that he felt much better. Recent illness, no headache no ear complaints no hearing complaints and when he has the vertigo was a true spinning sensation at this time he feels 100% fine.   Past Medical History:  Diagnosis Date  . Cancer [facility])    prostate  . Hypertension     There are no active problems to display for this patient.   Past Surgical History:  Procedure Laterality Date  . EYE SURGERY      Prior to Admission medications   Medication Sig Start Date End Date Taking? Authorizing Provider  acetaminophen (TYLENOL) 650 MG CR tablet Take 1,300 mg by mouth Nightly. 11/15/17   [provider]  chlorthalidone (HYGROTON) 25 MG tablet Take 12.5 mg by mouth every morning. 11/23/18 11/23/19   [provider]  Cod Liver Oil 5000-500 UNIT/5ML OIL Take by mouth. 07/20/13   [provider]  diclofenac sodium (VOLTAREN) 1 % GEL Apply 2 g topically 2 (two) times daily. 07/11/18 07/11/19  [provider]  losartan-hydrochlorothiazide (HYZAAR) 100-25 MG tablet Take by mouth. 11/15/17 11/15/18  [provider]  meclizine (ANTIVERT) 12.5 MG tablet Take 1 tablet (12.5 mg total) by mouth 3 (three) times daily as needed for dizziness. 12/22/18   Nena Polio, MD  Multiple Vitamins-Minerals (MULTIVITAMIN ADULT) TABS Take 1 tablet by mouth daily.    [provider]  Omega-3 1000 MG CAPS Take 2 g by mouth daily.    [provider]  polyethylene glycol (MIRALAX / GLYCOLAX) 17 g packet Take 1 packet by mouth daily. 07/20/16   [provider]  tamsulosin (FLOMAX) 0.4 MG CAPS capsule Take 0.4 mg by mouth.    [provider]    Allergies Patient has no known allergies.  No family history on file.  Social History Social History   Tobacco Use  . Smoking status: Never Smoker  . Smokeless tobacco: Never Used  Substance Use Topics  . Alcohol use: No  . Drug use: No    Review of Systems Constitutional: No fever/chills Eyes: No visual changes. ENT: No sore throat. No stiff neck no neck pain Cardiovascular: Denies chest pain. Respiratory: Denies shortness of breath. Gastrointestinal:   no vomiting.  No diarrhea.  No constipation. Genitourinary: Negative for dysuria. Musculoskeletal: Negative lower extremity swelling Skin: Negative for rash. Neurological: Negative for severe headaches, focal weakness or numbness.   ____________________________________________   PHYSICAL EXAM:  VITAL SIGNS: ED Triage Vitals  Enc Vitals Group     BP --      Pulse Rate 12/30/18 0520 64     Resp 12/30/18 0520 16     Temp 12/30/18 0520 98 F (36.7 C)     Temp Source 12/30/18 0520 Oral     SpO2 12/30/18 0520 97 %     Weight --       Height --      Head Circumference --      Peak Flow --      Pain Score 12/30/18 0522 0     Pain Loc --      Pain Edu? --      Excl. in [place]? --     Constitutional: Alert and oriented. Well appearing and in no acute distress. Eyes: Conjunctivae are normal Head: Atraumatic HEENT: No congestion/rhinnorhea. Mucous membranes are moist.  Oropharynx non-erythematous Neck:   Nontender with no meningismus, no masses, no stridor Cardiovascular: Normal rate, regular rhythm. Grossly normal heart sounds.  Good peripheral circulation. Respiratory: Normal respiratory effort.  No retractions. Lungs CTAB. Abdominal: Soft and nontender. No distention. No guarding no rebound Back:  There is no focal tenderness or step off.  there is no midline tenderness there are no lesions noted. there is no CVA tenderness Musculoskeletal: No lower extremity tenderness, no upper extremity tenderness. No joint effusions, no DVT signs strong distal pulses no edema Neurologic: Cranial nerves II through XII are grossly intact 5 out of 5 strength bilateral upper and lower extremity. Finger to nose within normal limits heel to shin within normal limits, speech is normal with no word finding difficulty or dysarthria, reflexes symmetric, pupils are equally round and reactive to light, there is no pronator drift, sensation is normal, vision is intact to confrontation, gait is deferred, there is no nystagmus, normal neurologic exam Skin:  Skin is warm, dry and intact. No rash noted. Psychiatric: Mood and affect are normal. Speech and behavior are normal.  ____________________________________________   LABS (all labs ordered are listed, but only abnormal results are displayed)  Labs Reviewed  CBC WITH DIFFERENTIAL/PLATELET  BASIC METABOLIC PANEL  URINALYSIS, COMPLETE (UACMP) WITH MICROSCOPIC    Pertinent labs  results that were available during my care of the patient were reviewed by me and considered in my medical decision  making (see chart for details). ____________________________________________  EKG  I personally interpreted any EKGs ordered by me or triage Sinus rhythm rate 58 bpm normal axis no acute ST elevation or depression unremarkable EKG aside from mild bradycardia ____________________________________________  RADIOLOGY  Pertinent labs & imaging results that were available during my care of the patient were reviewed by me and considered in my medical decision making (see chart for details). If possible, patient and/or family made aware of any abnormal findings.  No results found. ____________________________________________    PROCEDURES  Procedure(s) performed: None  Procedures  Critical Care performed: None  ____________________________________________   INITIAL IMPRESSION / ASSESSMENT AND PLAN / ED COURSE  Pertinent labs & imaging results that were available during my care of the patient were reviewed by me and considered in my medical decision making (see chart for details).  With an NIH stroke scale of 0 a recent MRI that was normal, TMs are normal, exam is normal at  this time, who states he has had intermittent vertigo for several years he is not having it now we will check basic blood work just because of his age because his creatinine was slightly elevated last time, we will give him Antivert here in case he has recurrence of symptomology and I think that at that time we will try to get him home if all that is negative and he still is asymptomatic given that we are in a pandemic and he is in a high risk age group.   ----------------------------------------- 6:45 AM on 12/30/2018 -----------------------------------------  Lites without difficulty no symptoms here work-up unremarkable creatinine actually slightly better than was a week ago and likely near his baseline, no evidence of significant dehydration, BUN is normal, neurologic exam remains normal he has no subjective or  objective symptoms of ongoing pathology had a full work-up for this including MRI a week ago.  I do not see any reason to keep him in the hospital especially given his very high risk status from picking up an infection we will try to discharge him.  Have encouraged him to take the medications as prescribed.  Called next of kin, daughter Hoyle Sauer, left a message.  No one picked up   ____________________________________________   FINAL CLINICAL IMPRESSION(S) / ED DIAGNOSES  Final diagnoses:  None      This chart was dictated using voice recognition software.  Despite best efforts to proofread,  errors can occur which can change meaning.      Schuyler Amor, MD 12/30/18 [id number]    Schuyler Amor, MD 12/30/18 0277    Schuyler Amor, MD 12/30/18 4128    Schuyler Amor, MD 12/30/18 [phone]

## 2018-12-30 NOTE — ED Triage Notes (Addendum)
EMS pt from home with c/o dizziness after waking this am; pt says he was feelingt fine when he went to bed; woke around 0430-0500 to use the bathroom and felt dizzy; pt here one week ago for same but says this was worse; pt adds he was given a prescription for meclizine that his daughter did not fill because she didn't think he needed it; pt awake and alert, talking in complete coherent sentences; pt denies dizziness currently

## 2018-12-30 NOTE — Discharge Instructions (Signed)
Follow closely with your doctor in the next couple days take the meclizine as needed for dizziness.  You have any numbness or weakness headache or stiff neck or trouble walking or other concerns return to the ER.

## 2018-12-30 NOTE — ED Notes (Signed)
Toney Rakes (daughter) notified of pt ready for discharge - she states she will be to pick him up in approx 69min - advised her to call into ED when she got to drop off area and we wouldbring pt out to her

## 2018-12-30 NOTE — ED Notes (Signed)
Dr Burlene Arnt straight in to see pt

## 2018-12-30 NOTE — ED Notes (Signed)
This EDT walked the patient in the halls/ pt walked "ok" with a semi unsteady gait. Pt often walked closer to items within reach for additional support. The patient stated that he was still dizzy but this was no different than what he had been experiencing at home. Pt stated " I really only came in because earlier tonight was the first time it'd ever gotten THAT bad where I saw the whole floor spinning". RN and MD made aware

## 2019-05-06 ENCOUNTER — Ambulatory Visit
Admission: EM | Admit: 2019-05-06 | Discharge: 2019-05-06 | Disposition: A | Discharge status: Home / Self Care | Payer: Medicare HMO | Attending: Family Medicine | Admitting: Family Medicine

## 2019-05-06 DIAGNOSIS — H1132 Conjunctival hemorrhage, left eye: Secondary | ICD-10-CM | POA: Diagnosis not present

## 2019-05-06 NOTE — ED Triage Notes (Signed)
Pt here for redness in his left eye. States it doesn't itch and said he can see something white in the tear ducts if he looks close enough. No pain reported and unsure if he got anything in it. But no injury. Has been going on for 2 or 3 days.

## 2019-05-06 NOTE — ED Provider Notes (Signed)
[facility] ____________________________________________  Time seen: Approximately 11:07 AM  I have reviewed the triage vital signs and the nursing notes.   HISTORY  Chief Complaint Eye Problem  HPI Joseph Estrada is a 85 y.o. male presenting with daughter at bedside for evaluation of left eye redness that he noticed on Saturday.  Reports noticed redness throughout the day, unsure if it was present personally in the morning as he did not feel any different.  Denies any burning, itching, drainage, injury, trauma, vision change, light sensitivity.  Denies any pain associated with this.  Reports vision has continued as normal.  Wears glasses.  Denies use of blood thinners.  No recent cough, sneezing or strenuous activity.  Daughter reports that Friday night he did not sleep well because he was worried about his pet cat and states that he may have been rubbing his eye.  Denies any foreign body sensation or foreign body.  Reports otherwise doing well.  No recent chest pain or shortness of breath or sickness.  Coralie Keens, MD: PCP   Past Medical History:  Diagnosis Date  . Cancer [facility])    prostate  . Hypertension     There are no active problems to display for this patient.   Past Surgical History:  Procedure Laterality Date  . EYE SURGERY       No current facility-administered medications for this encounter.   Current Outpatient Medications:  .  Cod Liver Oil 5000-500 UNIT/5ML OIL, Take by mouth., Disp: , Rfl:  .  Multiple Vitamins-Minerals (MULTIVITAMIN ADULT) TABS, Take 1 tablet by mouth daily., Disp: , Rfl:  .  tamsulosin (FLOMAX) 0.4 MG CAPS capsule, Take 0.4 mg by mouth., Disp: , Rfl:  .  acetaminophen (TYLENOL) 650 MG CR tablet, Take 1,300 mg by mouth Nightly., Disp: , Rfl:  .  chlorthalidone (HYGROTON) 25 MG tablet, Take 12.5 mg by mouth every morning., Disp: , Rfl:  .  diclofenac sodium (VOLTAREN) 1 % GEL, Apply 2 g topically 2 (two) times daily., Disp: ,  Rfl:  .  losartan-hydrochlorothiazide (HYZAAR) 100-25 MG tablet, Take by mouth., Disp: , Rfl:  .  meclizine (ANTIVERT) 12.5 MG tablet, Take 1 tablet (12.5 mg total) by mouth 3 (three) times daily as needed for dizziness., Disp: 30 tablet, Rfl: 0 .  Omega-3 1000 MG CAPS, Take 2 g by mouth daily., Disp: , Rfl:  .  polyethylene glycol (MIRALAX / GLYCOLAX) 17 g packet, Take 1 packet by mouth daily., Disp: , Rfl:   Allergies Patient has no known allergies.  History reviewed. No pertinent family history.  Social History Social History   Tobacco Use  . Smoking status: Never Smoker  . Smokeless tobacco: Never Used  Substance Use Topics  . Alcohol use: No  . Drug use: No    Review of Systems Constitutional: No fever/chills Eyes: No visual changes. Positive left eye redness. ENT: No sore throat. Cardiovascular: Denies chest pain. Respiratory: Denies shortness of breath. Gastrointestinal: No abdominal pain.   Skin: Negative for rash. Neurological: Negative for headaches, focal weakness or numbness.   ____________________________________________   PHYSICAL EXAM:  VITAL SIGNS: ED Triage Vitals  Enc Vitals Group     BP 05/06/19 1041 131/82     Pulse Rate 05/06/19 1041 96     Resp 05/06/19 1041 18     Temp 05/06/19 1041 98.4 F (36.9 C)     Temp Source 05/06/19 1041 Oral     SpO2 05/06/19 1041 99 %  Weight 05/06/19 1043 135 lb (61.2 kg)     Height --      Head Circumference --      Peak Flow --      Pain Score 05/06/19 1043 0     Pain Loc --      Pain Edu? --      Excl. in GC? --      Visual Acuity  Right Eye Distance: 20/20 Left Eye Distance: 20/25 Bilateral Distance:     Constitutional: Alert and oriented. Well appearing and in no acute distress. Eyes:  PERRL. EOMI. right conjunctive a normal.  Left conjunctiva medial aspect subconjunctival hemorrhage.  No drainage bilaterally.  Bilateral eyes nontender.  No surrounding tenderness, swelling or erythema.  Left  eye examined with tear seen, no dye uptake, no foreign body or corneal abrasion noted. ENT      Head: Normocephalic and atraumatic. Cardiovascular: Normal rate, regular rhythm. Grossly normal heart sounds.  Good peripheral circulation. Respiratory: Normal respiratory effort without tachypnea nor retractions. Breath sounds are clear and equal bilaterally. No wheezes, rales, rhonchi. Musculoskeletal:  Steady gait. Neurologic:  Normal speech and language. Speech is normal. No gait instability.  Skin:  Skin is warm, dry Psychiatric: Mood and affect are normal. Speech and behavior are normal. Patient exhibits appropriate insight and judgment   ___________________________________________   LABS (all labs ordered are listed, but only abnormal results are displayed)  Labs Reviewed - No data to display   PROCEDURES Procedures   Eye exam Procedure explained and verbal consent obtained.  Anesthesia: tetracaine ophthalmic 2 drops Left eye examined with fluorescein strip.  No foreign bodies visualized. No corneal abrasion noted.  Patient tolerated well.    INITIAL IMPRESSION / ASSESSMENT AND PLAN / ED COURSE  Pertinent labs & imaging results that were available during my care of the patient were reviewed by me and considered in my medical decision making (see chart for details).  Well-appearing patient.  No acute distress.  Daughter at bedside.  Left eye redness that he noticed, in which has no other accompanying symptoms.  Left subconjunctival hemorrhage noted.  Counseled to monitor.  If no improvement over the next several days recommend reevaluation follow-up with ophthalmology.  Reevaluation of her other complaints.  Discussed follow up and return parameters including no resolution or any worsening concerns. Patient verbalized understanding and agreed to plan.   ____________________________________________   FINAL CLINICAL IMPRESSION(S) / ED DIAGNOSES  Final diagnoses:   Subconjunctival hemorrhage, left     ED Discharge Orders    None       Note: This dictation was prepared with Dragon dictation along with smaller phrase technology. Any transcriptional errors that result from this process are unintentional.         Marylene Land, NP 05/06/19 1156

## 2019-05-06 NOTE — Discharge Instructions (Addendum)
Monitor as discussed.   Follow up with your primary care physician or ophthalmology as discussed. Return to Urgent care for new or worsening concerns.

## 2020-02-05 IMAGING — MR MRA HEAD WITHOUT CONTRAST
11 of 15 series · 32 of 48 positions shown · non-contrast
Comparison: Head CT 04/13/2007

CLINICAL DATA: Dizziness.  Ataxia.

EXAM:
MRI HEAD WITHOUT CONTRAST
MRA HEAD WITHOUT CONTRAST
TECHNIQUE: Multiplanar, multiecho pulse sequences of the brain and surrounding
structures were obtained without intravenous contrast. Angiographic
images of the head were obtained using MRA technique without
contrast.

[Series 5: ax dwi_tracew · axial · 3.0mm · 0.60mm/px · z∈[-30,+131]mm · 3 of 55 slices shown (1 of 2)]
[im 1/55]
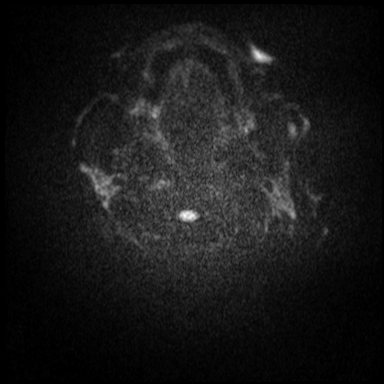
[im 28/55]
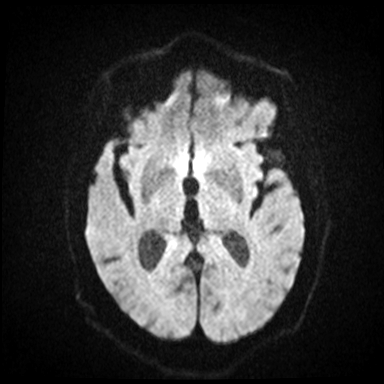
[im 55/55]
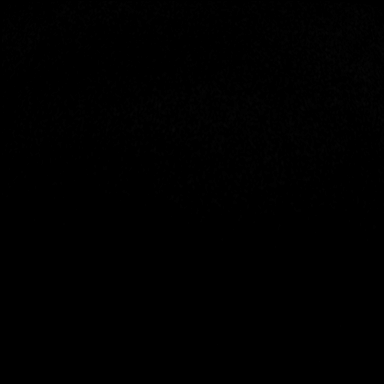

[Series 5: ax dwi_tracew · axial · 3.0mm · 0.60mm/px · z∈[-30,+131]mm · 3 of 55 slices shown (2 of 2)]
[im 1/55]
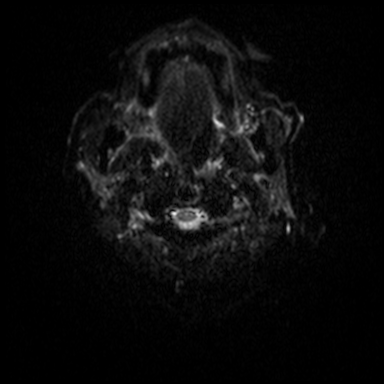
[im 28/55]
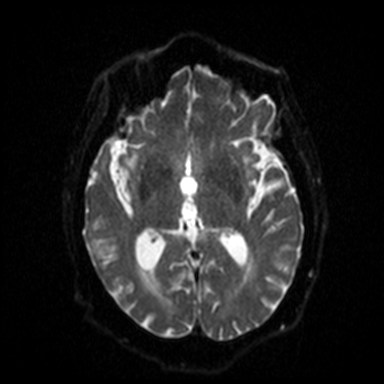
[im 55/55]
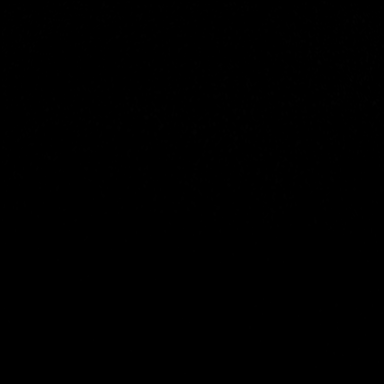

[Series 6: ax dwi_adc · axial · 3.0mm · 0.60mm/px · z∈[-30,+125]mm · 3 of 53 slices shown]
[im 1/53]
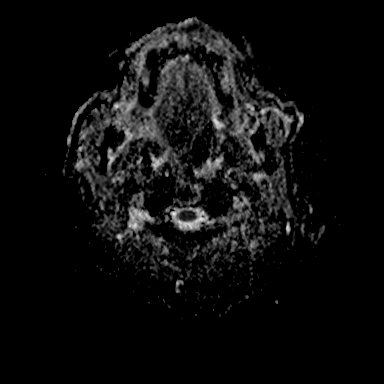
[im 27/53]
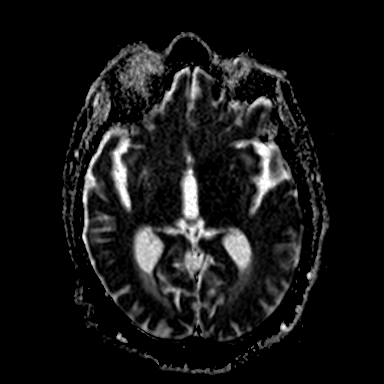
[im 53/53]
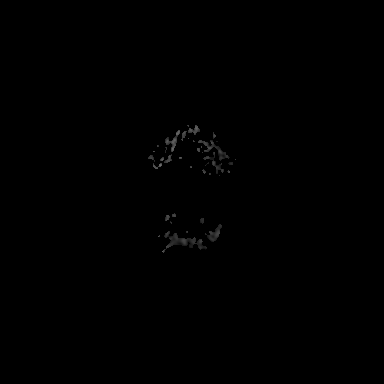

[Series 9: cor dwi_tracew · coronal · 5.0mm · 0.60mm/px · 2 of 45 slices shown]
[im 1/45]
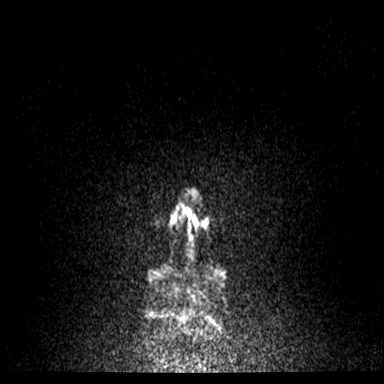
[im 45/45]
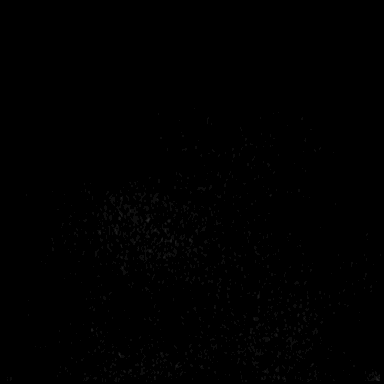

[Series 10: cor dwi_adc · coronal · 5.0mm · 0.60mm/px · 2 of 44 slices shown]
[im 1/44]
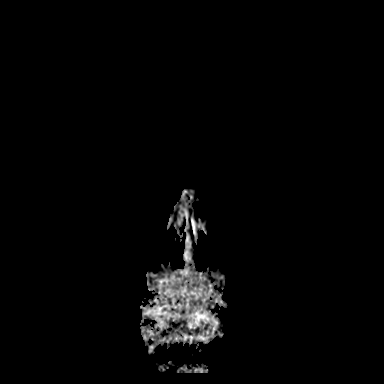
[im 44/44]
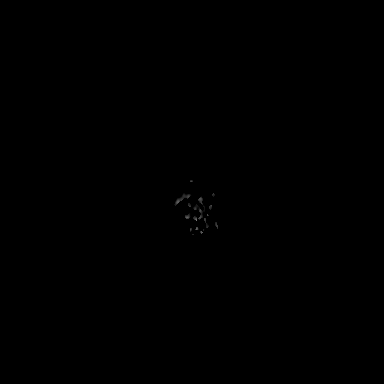

[Series 11: T1 · sagittal · 5.0mm · 0.62mm/px · 1 of 25 slices shown (1 of 2)]
[im 1/25]
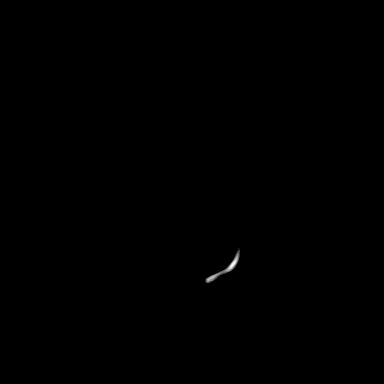

[Series 12: TOF · axial · 0.5mm · 0.41mm/px · z∈[-19,+37]mm · 5 of 205 slices shown]
[im 1/205]
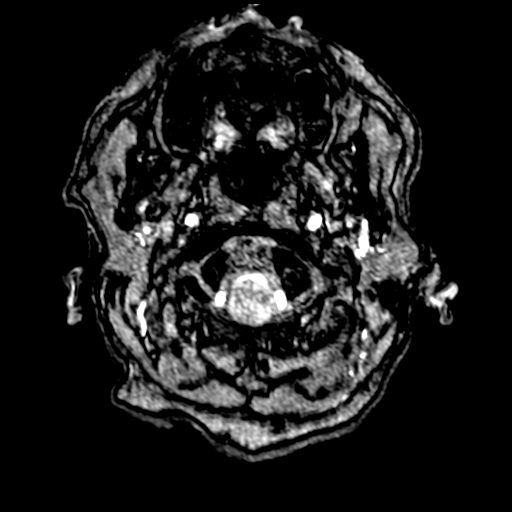
[im 23/205]
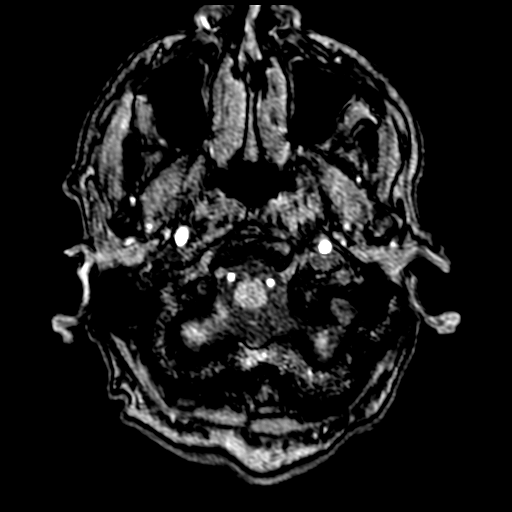
[im 69/205]
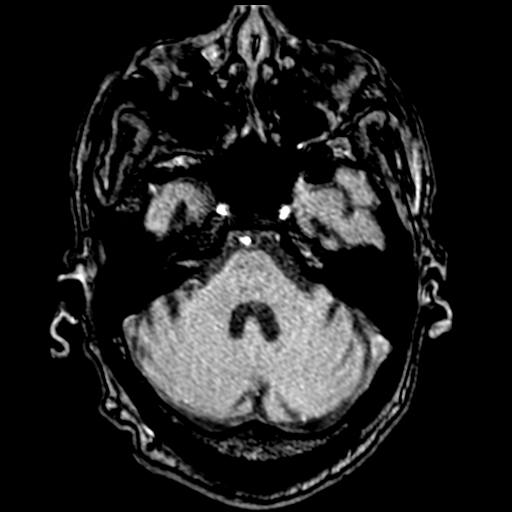
[im 91/205]
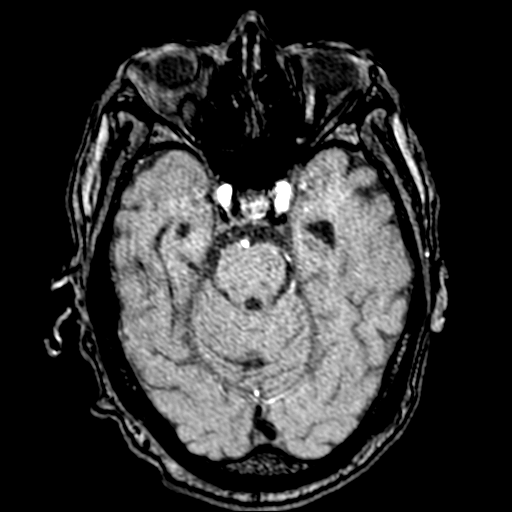
[im 114/205]
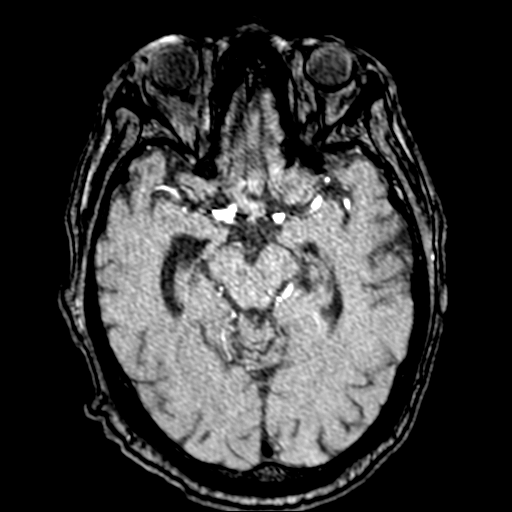

[Series 17: T2 · axial · 5.0mm · 0.53mm/px · 1 of 27 slices shown (1 of 2)]
[im 1/27]
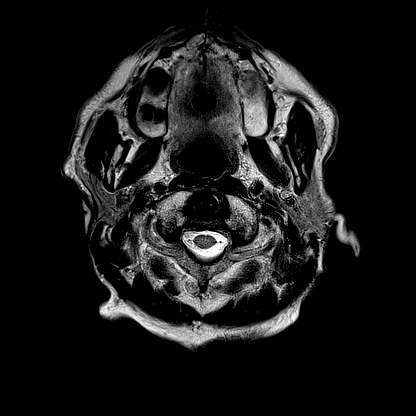

[Series 22: FLAIR · axial · 3.0mm · 0.53mm/px · z∈[-24,+137]mm · 3 of 55 slices shown]
[im 1/55]
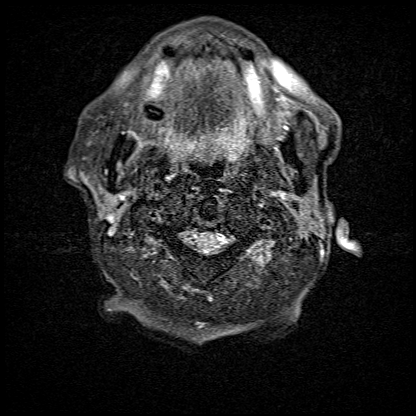
[im 28/55]
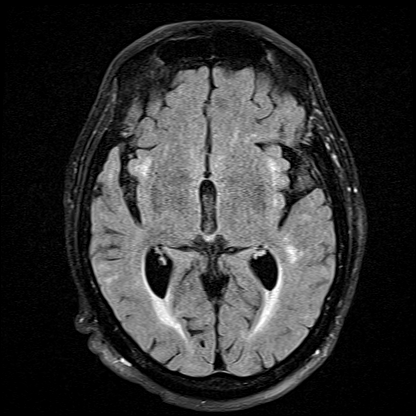
[im 55/55]
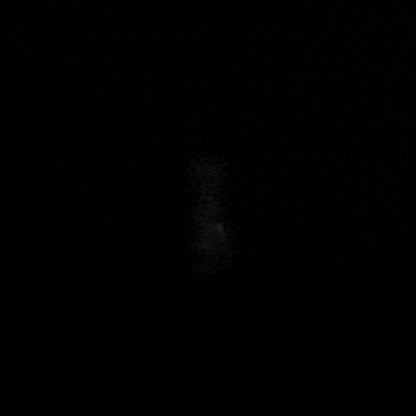

[Series 23: T1 · axial · 1.0mm · 0.98mm/px · z∈[-28,+146]mm · 8 of 171 slices shown (2 of 2)]
[im 1/171]
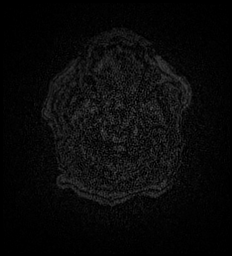
[im 22/171]
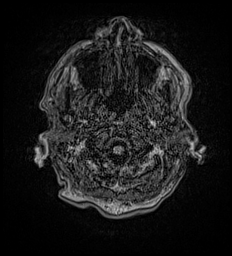
[im 43/171]
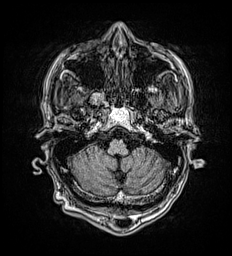
[im 64/171]
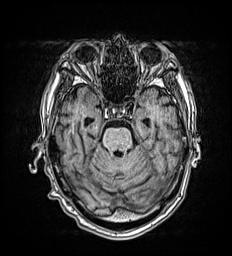
[im 107/171]
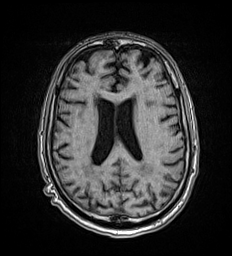
[im 128/171]
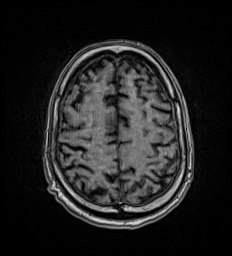
[im 149/171]
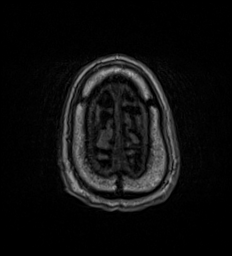
[im 171/171]
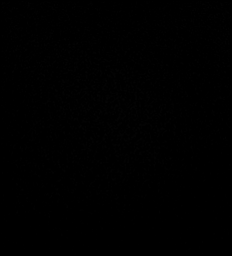

[Series 24: T2 · coronal · 5.0mm · 0.57mm/px · 1 of 29 slices shown (2 of 2)]
[im 1/29]
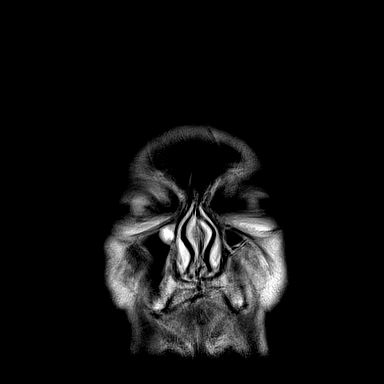

[32 of 48 positions shown; findings below may reference images not displayed]

FINDINGS: MRI HEAD FINDINGS

Brain: There is no evidence of acute infarct, mass, midline shift,
or extra-axial fluid collection. Chronic microhemorrhages are noted
in the left frontal lobe in near the posterior aspect of the right
insula. Patchy T2 hyperintensities throughout the cerebral white
matter bilaterally are nonspecific but compatible with chronic small
vessel ischemic disease, moderately advanced for age. Generalized
cerebral atrophy has progressed from 6442 but is not greater than
expected for age.

Vascular: Major intracranial vascular flow voids are preserved.

Skull and upper cervical spine: Unremarkable bone marrow signal.

Sinuses/Orbits: Bilateral cataract extraction. Small right maxillary
sinus mucous retention cyst. Clear mastoid air cells.

Other: None.

MRA HEAD FINDINGS

The study is mildly motion degraded.

The visualized distal vertebral arteries are widely patent to the
basilar and codominant. Patent left PICA and bilateral SCAs are
visualized. A right PICA and AICAs are not well seen. The basilar
artery is patent without evidence of significant stenosis. There are
medium sized posterior communicating arteries bilaterally. The PCAs
are patent with asymmetric distal branch vessel attenuation on the
right. There is a mild distal right P1 stenosis.

The internal carotid arteries are widely patent from skull base to
carotid termini. ACAs and MCAs are patent with branch vessel
irregularity and attenuation but no evidence of proximal branch
occlusion or flow limiting proximal stenosis. No aneurysm is
identified.
IMPRESSION: 1. No acute intracranial abnormality.
2. Moderate chronic small vessel ischemic disease.
3. Mild intracranial atherosclerotic changes without large vessel
occlusion or flow limiting proximal stenosis.

## 2023-09-06 DEATH — deceased
